# Patient Record
Sex: Male | Born: 2000 | Hispanic: No | Marital: Single | Smoking: Never smoker
Health system: Southern US, Community
[De-identification: ages and names within clinical notes are randomized; demographics above are authoritative.]

---
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
## 2020-12-11 ENCOUNTER — Ambulatory Visit (INDEPENDENT_AMBULATORY_CARE_PROVIDER_SITE_OTHER): Payer: Medicaid Other

## 2020-12-11 ENCOUNTER — Ambulatory Visit: Payer: Medicaid Other | Admitting: Podiatry

## 2020-12-11 ENCOUNTER — Other Ambulatory Visit: Payer: Self-pay

## 2020-12-11 DIAGNOSIS — S99912A Unspecified injury of left ankle, initial encounter: Secondary | ICD-10-CM

## 2020-12-11 NOTE — Progress Notes (Signed)
Subjective:   Patient ID: Leonard Dixon, male   DOB: 20 y.o.   MRN: [id number]   HPI Patient presents stating that he twisted his left ankle and is worried about a fracture stating [place].  Patient states he is working but it is sore.  Patient is not currently smoking and likes to be active   Review of Systems  All other systems reviewed and are negative.       Objective:  Physical Exam Vitals and nursing note reviewed.  Constitutional:      Appearance: He is well-developed.  Pulmonary:     Effort: Pulmonary effort is normal.  Musculoskeletal:        General: Normal range of motion.  Skin:    General: Skin is warm.  Neurological:     Mental Status: He is alert.     Neurovascular status intact muscle strength adequate range of motion adequate patient found to have swelling of the left ankle lateral side with moderate excessive inversion but he is flexible on both sides.  There is ecchymosis around the area but localized no proximal erythema edema negative Homans' sign noted     Assessment:  Significant ankle sprain left cannot rule out fracture     Plan:  H&P x-rays reviewed recommended compression elevation immobilization as needed and then gradual recovery should occur but may take 8 to 12 weeks for complete recovery  X-rays indicate that there is no signs of fracture or diastases injury of the left ankle
